# Patient Record
Sex: Female | Born: 1960 | Race: White | Hispanic: No | Marital: Married | Smoking: Never smoker
Health system: Southern US, Community
[De-identification: ages and names within clinical notes are randomized; demographics above are authoritative.]

## PROBLEM LIST (undated history)

## (undated) DIAGNOSIS — F419 Anxiety disorder, unspecified: Secondary | ICD-10-CM

## (undated) HISTORY — PX: CHOLECYSTECTOMY: SHX55

---

## 2006-05-06 ENCOUNTER — Ambulatory Visit: Payer: Self-pay | Admitting: Internal Medicine

## 2006-05-21 ENCOUNTER — Ambulatory Visit: Payer: Self-pay | Admitting: Family Medicine

## 2006-05-26 ENCOUNTER — Ambulatory Visit: Payer: Self-pay | Admitting: Family Medicine

## 2006-06-30 ENCOUNTER — Ambulatory Visit: Payer: Self-pay | Admitting: Gastroenterology

## 2006-07-08 ENCOUNTER — Ambulatory Visit: Payer: Self-pay | Admitting: Gastroenterology

## 2006-08-03 ENCOUNTER — Ambulatory Visit: Payer: Self-pay | Admitting: Surgery

## 2007-01-23 ENCOUNTER — Emergency Department: Payer: Self-pay | Admitting: Emergency Medicine

## 2007-01-23 ENCOUNTER — Other Ambulatory Visit: Payer: Self-pay

## 2007-05-24 ENCOUNTER — Ambulatory Visit: Payer: Self-pay | Admitting: Nurse Practitioner

## 2007-11-24 ENCOUNTER — Ambulatory Visit: Payer: Self-pay | Admitting: Gastroenterology

## 2008-10-02 ENCOUNTER — Ambulatory Visit: Payer: Self-pay | Admitting: Nurse Practitioner

## 2009-08-05 ENCOUNTER — Ambulatory Visit: Payer: Self-pay | Admitting: Family Medicine

## 2009-08-29 ENCOUNTER — Ambulatory Visit: Payer: Self-pay | Admitting: Family Medicine

## 2009-10-26 ENCOUNTER — Ambulatory Visit: Payer: Self-pay | Admitting: Nurse Practitioner

## 2009-11-12 ENCOUNTER — Ambulatory Visit: Payer: Self-pay | Admitting: Nurse Practitioner

## 2009-11-13 ENCOUNTER — Ambulatory Visit: Payer: Self-pay | Admitting: Nurse Practitioner

## 2011-10-07 ENCOUNTER — Ambulatory Visit: Payer: Self-pay | Admitting: Internal Medicine

## 2012-10-22 ENCOUNTER — Ambulatory Visit: Payer: Self-pay | Admitting: Emergency Medicine

## 2015-05-15 ENCOUNTER — Encounter: Payer: Self-pay | Admitting: *Deleted

## 2015-05-15 ENCOUNTER — Ambulatory Visit
Admission: EM | Admit: 2015-05-15 | Discharge: 2015-05-15 | Disposition: A | Discharge status: Home / Self Care | Payer: 59 | Attending: Family Medicine | Admitting: Family Medicine

## 2015-05-15 DIAGNOSIS — R300 Dysuria: Secondary | ICD-10-CM

## 2015-05-15 DIAGNOSIS — R350 Frequency of micturition: Secondary | ICD-10-CM

## 2015-05-15 LAB — URINALYSIS COMPLETE WITH MICROSCOPIC (ARMC ONLY)
BACTERIA UA: NONE SEEN
Bilirubin Urine: NEGATIVE
Glucose, UA: NEGATIVE mg/dL
HGB URINE DIPSTICK: NEGATIVE
Ketones, ur: NEGATIVE mg/dL
LEUKOCYTES UA: NEGATIVE
NITRITE: NEGATIVE
PH: 7 (ref 5.0–8.0)
PROTEIN: NEGATIVE mg/dL
RBC / HPF: NONE SEEN RBC/hpf (ref 0–5)
Specific Gravity, Urine: 1.01 (ref 1.005–1.030)
WBC UA: NONE SEEN WBC/hpf (ref 0–5)

## 2015-05-15 MED ORDER — CIPROFLOXACIN HCL 250 MG PO TABS: 250.0000 mg | ORAL_TABLET | Freq: Two times a day (BID) | ORAL | Status: AC

## 2015-05-15 NOTE — ED Notes (Signed)
Patient started having symptoms of lower pelvic pain, tiredness, and urinary urgency two days ago. Last reported UTI occurred 10 years ago.

## 2015-05-15 NOTE — ED Provider Notes (Signed)
CSN: [id number]     Arrival date & time 05/15/15  1225 History   First MD Initiated Contact with Patient 05/15/15 1510     Chief Complaint  Patient presents with  . Urinary Tract Infection   (Consider location/radiation/quality/duration/timing/severity/associated sxs/prior Treatment) Patient is a 54 y.o. female presenting with dysuria. The history is provided by the patient.  Dysuria Pain quality:  Burning and aching Pain severity:  Mild Onset quality:  Sudden Duration:  2 days Timing:  Constant Progression:  Worsening Chronicity:  New Recent urinary tract infections: no   Relieved by:  None tried Urinary symptoms: frequent urination   Urinary symptoms: no hematuria and no bladder incontinence   Associated symptoms: no abdominal pain, no fever, no flank pain, no nausea, no vaginal discharge and no vomiting     History reviewed. No pertinent past medical history. Past Surgical History  Procedure Laterality Date  . Cholecystectomy     History reviewed. No pertinent family history. Social History  Substance Use Topics  . Smoking status: Never Smoker   . Smokeless tobacco: Never Used  . Alcohol Use: Yes   OB History    No data available     Review of Systems  Constitutional: Negative for fever.  Gastrointestinal: Negative for nausea, vomiting and abdominal pain.  Genitourinary: Positive for dysuria. Negative for flank pain and vaginal discharge.    Allergies  Review of patient's allergies indicates no known allergies.  Home Medications   Prior to Admission medications   Medication Sig Start Date End Date Taking? Authorizing Provider  ciprofloxacin (CIPRO) 250 MG tablet Take 1 tablet (250 mg total) by mouth every 12 (twelve) hours. 05/15/15   Norval Gable, MD   Meds Ordered and Administered this Visit  Medications - No data to display  BP 138/83 mmHg  Pulse 86  Temp(Src) 98 F (36.7 C) (Oral)  Resp 18  Ht 5\' 3"  (1.6 m)  Wt 130 lb (58.968 kg)  BMI 23.03  kg/m2  SpO2 100% No data found.   Physical Exam  Constitutional: She appears well-developed and well-nourished. No distress.  Abdominal: Soft. Bowel sounds are normal. She exhibits no distension and no mass. There is tenderness (suprapubic tenderness to palpation; no rebound or guarding). There is no rebound and no guarding.  Skin: She is not diaphoretic.  Nursing note and vitals reviewed.   ED Course  Procedures (including critical care time)  Labs Review Labs Reviewed  URINALYSIS COMPLETEWITH MICROSCOPIC ([facility] ONLY) - Abnormal; Notable for the following:    Color, Urine STRAW (*)    Squamous Epithelial / LPF 0-5 (*)    All other components within normal limits  URINE CULTURE    Imaging Review No results found.   Visual Acuity Review  Right Eye Distance:   Left Eye Distance:   Bilateral Distance:    Right Eye Near:   Left Eye Near:    Bilateral Near:         MDM   1. Urinary frequency   2. Dysuria    Discharge Medication List as of 05/15/2015  3:26 PM    START taking these medications   Details  ciprofloxacin (CIPRO) 250 MG tablet Take 1 tablet (250 mg total) by mouth every 12 (twelve) hours., Starting 05/15/2015, Until Discontinued, Normal       1. Lab results and diagnosis reviewed with patient 2. rx as per orders above; reviewed possible side effects, interactions, risks and benefits 3. Recommend supportive treatment with increased fluids, otc analgesics  4. Follow-up prn if symptoms worsen or don't improve    Norval Gable, MD 05/15/15 2126

## 2015-05-17 LAB — URINE CULTURE: Culture: 4000

## 2015-05-17 NOTE — ED Notes (Signed)
Final report of urine C&S shows insignificant growth 

## 2015-10-20 ENCOUNTER — Ambulatory Visit (INDEPENDENT_AMBULATORY_CARE_PROVIDER_SITE_OTHER): Payer: 59

## 2015-10-20 ENCOUNTER — Encounter: Payer: Self-pay | Admitting: *Deleted

## 2015-10-20 ENCOUNTER — Ambulatory Visit
Admission: EM | Admit: 2015-10-20 | Discharge: 2015-10-20 | Disposition: A | Discharge status: Home / Self Care | Payer: 59 | Attending: Family Medicine | Admitting: Family Medicine

## 2015-10-20 DIAGNOSIS — R0781 Pleurodynia: Secondary | ICD-10-CM

## 2015-10-20 DIAGNOSIS — M940 Chondrocostal junction syndrome [Tietze]: Secondary | ICD-10-CM | POA: Insufficient documentation

## 2015-10-20 DIAGNOSIS — R079 Chest pain, unspecified: Secondary | ICD-10-CM | POA: Diagnosis present

## 2015-10-20 MED ORDER — TRAMADOL HCL 50 MG PO TABS: 50.0000 mg | ORAL_TABLET | Freq: Two times a day (BID) | ORAL | Status: AC | PRN

## 2015-10-20 MED ORDER — KETOROLAC TROMETHAMINE 60 MG/2ML IM SOLN
60.0000 mg | Freq: Once | INTRAMUSCULAR | Status: AC
  Administered 2015-10-20: 60 mg via INTRAMUSCULAR

## 2015-10-20 MED ORDER — MELOXICAM 15 MG PO TABS: 15.0000 mg | ORAL_TABLET | Freq: Every day | ORAL | Status: AC

## 2015-10-20 NOTE — ED Notes (Signed)
Right lateral and anterior right lower rib pain, sudden onset Tuesday. Denies injury. Pain worse with movement and breathing.

## 2015-10-20 NOTE — ED Provider Notes (Addendum)
CSN: [id number]     Arrival date & time 10/20/15  1138 History   First MD Initiated Contact with Patient 10/20/15 1230    Nurses notes were reviewed. Chief Complaint  Patient presents with  . Chest Pain  Patient reports since having URI a few weeks ago. She sneezed this past Tuesday and started developing right rib pain/chest pain progressively gotten worse since Tuesday. She reports if she lays left-sided night to sleep she is unable to rest because it wakes her up. She reports feeling miserable and the pain is continuing to get progressively worse. She denies any pain on the left side. She has pain when she moves she takes a deep breath involving her right side. She denies any trauma or injury to the right side right wrist.  She does not smoke she avoids who do smoke. She's had gallbladder surgery she's on no chronic medications and she doesn't have any drug allergies.. There is some family history of hypertension. (Consider location/radiation/quality/duration/timing/severity/associated sxs/prior Treatment) Patient is a 55 y.o. female presenting with chest pain. The history is provided by the patient. No language interpreter was used.  Chest Pain Pain location:  R chest and R lateral chest Pain quality: aching, pressure, sharp and stabbing   Pain radiates to:  Does not radiate Pain radiates to the back: no   Pain severity:  Severe Onset quality:  Sudden Duration:  5 days Progression:  Worsening Chronicity:  New Context: breathing and movement   Context: no stress and no trauma   Relieved by:  Nothing Ineffective treatments:  None tried Associated symptoms: no abdominal pain, no altered mental status, no cough, no dizziness, no numbness and no palpitations   Risk factors: no coronary artery disease, no diabetes mellitus, no high cholesterol, no hypertension, not female, not obese, not pregnant, no prior DVT/PE, no smoking and no surgery     History reviewed. No pertinent past medical  history. Past Surgical History  Procedure Laterality Date  . Cholecystectomy     History reviewed. No pertinent family history. Social History  Substance Use Topics  . Smoking status: Never Smoker   . Smokeless tobacco: Never Used  . Alcohol Use: Yes   OB History    No data available     Review of Systems  Respiratory: Negative for cough.   Cardiovascular: Positive for chest pain. Negative for palpitations.  Gastrointestinal: Negative for abdominal pain.  Neurological: Negative for dizziness and numbness.    Allergies  Review of patient's allergies indicates no known allergies.  Home Medications   Prior to Admission medications   Medication Sig Start Date End Date Taking? Authorizing Provider  ciprofloxacin (CIPRO) 250 MG tablet Take 1 tablet (250 mg total) by mouth every 12 (twelve) hours. 05/15/15   Norval Gable, MD  meloxicam (MOBIC) 15 MG tablet Take 1 tablet (15 mg total) by mouth daily. 10/20/15   Frederich Cha, MD  traMADol (ULTRAM) 50 MG tablet Take 1 tablet (50 mg total) by mouth every 12 (twelve) hours as needed for moderate pain or severe pain (Pain that keeps you from sleeping and resting may cause sedation). 10/20/15   Frederich Cha, MD   Meds Ordered and Administered this Visit   Medications  ketorolac (TORADOL) injection 60 mg (60 mg Intramuscular Given 10/20/15 1252)    BP 153/92 mmHg  Pulse 90  Temp(Src) 98 F (36.7 C) (Oral)  Resp 16  Ht 5\' 3"  (1.6 m)  Wt 128 lb (58.06 kg)  BMI 22.68 kg/m2  SpO2 99% No data found.   Physical Exam  Constitutional: She is oriented to person, place, and time. She appears well-developed and well-nourished.  HENT:  Head: Normocephalic and atraumatic.  Eyes: Pupils are equal, round, and reactive to light.  Neck: Normal range of motion. Neck supple. No thyromegaly present.  Cardiovascular: Normal rate, regular rhythm, S1 normal, S2 normal and normal heart sounds.   Pulmonary/Chest: Effort normal and breath sounds normal.  She has no decreased breath sounds. She has no wheezes. She exhibits tenderness and bony tenderness.    Abdominal: Soft. Normal appearance. She exhibits no distension. There is no tenderness. There is no CVA tenderness, no tenderness at McBurney's point and negative Murphy's sign. No hernia.  Neurological: She is alert and oriented to person, place, and time.  Skin: Skin is warm and dry. No rash noted.  Psychiatric: She has a normal mood and affect.  Vitals reviewed.   ED Course  Procedures (including critical care time)  Labs Review Labs Reviewed - No data to display  Imaging Review Dg Ribs Unilateral W/chest Right  10/20/2015  CLINICAL DATA:  Right-sided rib pain 4 days.  No injury. EXAM: RIGHT RIBS AND CHEST - 3+ VIEW COMPARISON:  None. FINDINGS: Lungs are clear. Cardiomediastinal silhouette is within normal. There is mild curvature of the thoracic spine convex right. Mild degenerative change of the spine. No evidence of right rib fracture. IMPRESSION: No acute findings. Electronically Signed   By: Marin Olp M.D.   On: 10/20/2015 13:21     Visual Acuity Review  Right Eye Distance:   Left Eye Distance:   Bilateral Distance:    Right Eye Near:   Left Eye Near:    Bilateral Near:         MDM   1. Rib pain on right side   2. Costochondritis, acute    Some improvement with the Toradol injection. Continue with anti-inflammatory such as Mobic 15 mg. Will also give her tramadol to use at night to help her restl and  sleep. Follow-up with PCP of choice if not better in 2 weeks and she doesn't feel like she is improving.  ED ECG REPORT I, Reyne Falconi H, the attending physician, personally viewed and interpreted this ECG.   Date: 10/20/2015  EKG Time:13:05:03  Rate: 70  Rhythm: normal sinus rhythm  Axis: 75  Intervals:none  ST&T Change: none borderline L Atrium enlargement  Note: This dictation was prepared with Dragon dictation along with smaller phrase technology.  Any transcriptional errors that result from this process are unintentional.  Frederich Cha, MD 10/20/15 1347  Frederich Cha, MD 10/20/15 1355

## 2015-10-20 NOTE — Discharge Instructions (Signed)
Costochondritis  Costochondritis is a condition in which the tissue (cartilage) that connects your ribs with your breastbone (sternum) becomes irritated. It causes pain in the chest and rib area. It usually goes away on its own over time.  HOME CARE  · Avoid activities that wear you out.  · Do not strain your ribs. Avoid activities that use your:    Chest.    Belly.    Side muscles.  · Put ice on the area for the first 2 days after the pain starts.    Put ice in a plastic bag.    Place a towel between your skin and the bag.    Leave the ice on for 20 minutes, 2-3 times a day.  · Only take medicine as told by your doctor.  GET HELP IF:  · You have redness or puffiness (swelling) in the rib area.  · Your pain does not go away with rest or medicine.  GET HELP RIGHT AWAY IF:   · Your pain gets worse.  · You are very uncomfortable.  · You have trouble breathing.  · You cough up blood.  · You start sweating or throwing up (vomiting).  · You have a fever or lasting symptoms for more than 2-3 days.  · You have a fever and your symptoms suddenly get worse.  MAKE SURE YOU:   · Understand these instructions.  · Will watch your condition.  · Will get help right away if you are not doing well or get worse.     This information is not intended to replace advice given to you by your health care provider. Make sure you discuss any questions you have with your health care provider.     Document Released: 11/12/2007 Document Revised: 01/26/2013 Document Reviewed: 12/28/2012  Elsevier Interactive Patient Education ©2016 [employer]

## 2016-07-31 ENCOUNTER — Encounter: Payer: Self-pay | Admitting: *Deleted

## 2016-07-31 ENCOUNTER — Ambulatory Visit
Admission: EM | Admit: 2016-07-31 | Discharge: 2016-07-31 | Disposition: A | Discharge status: Home / Self Care | Payer: 59 | Attending: Family Medicine | Admitting: Family Medicine

## 2016-07-31 DIAGNOSIS — R0789 Other chest pain: Secondary | ICD-10-CM | POA: Diagnosis not present

## 2016-07-31 DIAGNOSIS — R200 Anesthesia of skin: Secondary | ICD-10-CM | POA: Diagnosis present

## 2016-07-31 DIAGNOSIS — F419 Anxiety disorder, unspecified: Secondary | ICD-10-CM | POA: Insufficient documentation

## 2016-07-31 DIAGNOSIS — R079 Chest pain, unspecified: Secondary | ICD-10-CM | POA: Diagnosis not present

## 2016-07-31 HISTORY — DX: Anxiety disorder, unspecified: F41.9

## 2016-07-31 MED ORDER — ASPIRIN 81 MG PO CHEW
324.0000 mg | CHEWABLE_TABLET | Freq: Once | ORAL | Status: AC
  Administered 2016-07-31: 324 mg via ORAL

## 2016-07-31 NOTE — ED Provider Notes (Signed)
[facility]    CSN: [id number] Arrival date & time: 07/31/16  1855     History   Chief Complaint Chief Complaint  Patient presents with  . Chest Pain  . Numbness    HPI Margaret Mayo is a 56 y.o. female.   Patient is a 56 year old white female who since Monday has been having heart racing and palpitations off and on. That is a new finding for her and while that has occurred for the last previous 3 days today while she was driving home started having now in the palpitations chest tightness and sensation of the chest tightness going up her left neck and down the left arm. She also had some numbness of the left arm and left leg she's had numbness of the arm and leg before but never had chest pain that she had today. Is no history of hypertension but blood pressures elevated here no hyperlipidemia she does not smoke mother father in good health but her maternal grandfather died in 79s from sudden death no known drug allergies. His had her gallbladder out only surgery as she is on no chronic medications.   The history is provided by the patient. No language interpreter was used.  Chest Pain  Pain location:  Substernal area Pain quality: pressure   Pain radiates to:  Neck and L arm Pain severity:  Moderate Onset quality:  Sudden Timing:  Constant Progression:  Partially resolved Chronicity:  New Context: not breathing   Relieved by:  Nothing Exacerbated by: under stress. Associated symptoms: no shortness of breath     Past Medical History:  Diagnosis Date  . Anxiety     There are no active problems to display for this patient.   Past Surgical History:  Procedure Laterality Date  . CHOLECYSTECTOMY      OB History    No data available       Home Medications    Prior to Admission medications   Medication Sig Start Date End Date Taking? Authorizing Provider  ciprofloxacin (CIPRO) 250 MG tablet Take 1 tablet (250 mg total) by mouth every 12 (twelve)  hours. 05/15/15   Norval Gable, MD  meloxicam (MOBIC) 15 MG tablet Take 1 tablet (15 mg total) by mouth daily. 10/20/15   Frederich Cha, MD  traMADol (ULTRAM) 50 MG tablet Take 1 tablet (50 mg total) by mouth every 12 (twelve) hours as needed for moderate pain or severe pain (Pain that keeps you from sleeping and resting may cause sedation). 10/20/15   Frederich Cha, MD    Family History History reviewed. No pertinent family history.  Social History Social History  Substance Use Topics  . Smoking status: Never Smoker  . Smokeless tobacco: Never Used  . Alcohol use Yes     Allergies   Patient has no known allergies.   Review of Systems Review of Systems  Constitutional: Negative.   Respiratory: Positive for chest tightness. Negative for shortness of breath.   Cardiovascular: Positive for chest pain.  All other systems reviewed and are negative.    Physical Exam Triage Vital Signs ED Triage Vitals  Enc Vitals Group     BP 07/31/16 1909 (!) 169/87     Pulse Rate 07/31/16 1909 84     Resp 07/31/16 1909 16     Temp 07/31/16 1909 98.1 F (36.7 C)     Temp Source 07/31/16 1909 Oral     SpO2 07/31/16 1909 100 %     Weight 07/31/16  1910 130 lb (59 kg)     Height 07/31/16 1910 5\' 3"  (1.6 m)     Head Circumference --      Peak Flow --      Pain Score 07/31/16 1912 7     Pain Loc --      Pain Edu? --      Excl. in [place]? --    No data found.   Updated Vital Signs BP (!) 169/87 (BP Location: Right Arm)   Pulse 84   Temp 98.1 F (36.7 C) (Oral)   Resp 16   Ht 5\' 3"  (1.6 m)   Wt 130 lb (59 kg)   SpO2 100%   BMI 23.03 kg/m   Visual Acuity Right Eye Distance:   Left Eye Distance:   Bilateral Distance:    Right Eye Near:   Left Eye Near:    Bilateral Near:     Physical Exam  Constitutional: She is oriented to person, place, and time. She appears well-developed and well-nourished.  HENT:  Head: Normocephalic and atraumatic.  Eyes: Conjunctivae are normal. Pupils are  equal, round, and reactive to light.  Neck: Trachea normal, normal range of motion and full passive range of motion without pain. Neck supple. No thyroid mass and no thyromegaly present.  Cardiovascular: Normal rate, regular rhythm, S1 normal and normal heart sounds.   Pulmonary/Chest: Effort normal and breath sounds normal. She exhibits no mass and no tenderness.  Abdominal: Soft.  Musculoskeletal: Normal range of motion.  Lymphadenopathy:    She has no cervical adenopathy.  Neurological: She is alert and oriented to person, place, and time.  Skin: Skin is warm.  Psychiatric: She has a normal mood and affect.  Vitals reviewed.    UC Treatments / Results  Labs (all labs ordered are listed, but only abnormal results are displayed) Labs Reviewed - No data to display  EKG  EKG Interpretation None      ED ECG REPORT I, Mirko Tailor H, the attending physician, personally viewed and interpreted this ECG.   Date: 07/31/2016  EKG Time: 19:18:43  Rate: 78  Rhythm: normal EKG, normal sinus rhythm, there are no previous tracings available for comparison  Axis: 71  Intervals:none  ST&T Change: none  Radiology No results found.  Procedures Procedures (including critical care time)  Medications Ordered in UC Medications  aspirin chewable tablet 324 mg (324 mg Oral Given 07/31/16 1944)     Initial Impression / Assessment and Plan / UC Course  I have reviewed the triage vital signs and the nursing notes.  Pertinent labs & imaging results that were available during my care of the patient were reviewed by me and considered in my medical decision making (see chart for details).   patient because of her age and inability to do any reproduction of the chest pain with palpation of her chest wall will need to go to the emergency room of her choice. Blood pressures also was elevated so concerned that she does need to be evaluated with probable serial cardiac enzymes. She and her husband  have decided to go to [facility] ED. He is also going to drive her the choice. 4 baby aspirin as we given here and they'll be notified of their pending arrival  Final Clinical Impressions(s) / UC Diagnoses   Final diagnoses:  Chest pain, unspecified type  Chest tightness or pressure    New Prescriptions New Prescriptions   No medications on file    Note: This  dictation was prepared with Dragon dictation along with smaller phrase technology. Any transcriptional errors that result from this process are unintentional.   Frederich Cha, MD 07/31/16 2000

## 2016-07-31 NOTE — ED Notes (Signed)
Smile is symmetric, negative arm drift, speech is clear, and no facial ptosis.

## 2016-07-31 NOTE — ED Triage Notes (Signed)
Chest "pressure" 7/10 onset today approx 1700. Pt has had palpitations since Monday. Pressure does not radiate. She has had diaphoresis with pressure at times. Also states her left arm, left face and thigh are numb.

## 2016-07-31 NOTE — ED Notes (Signed)
Report called to Jesus, Warehouse manager at [facility].

## 2016-09-03 ENCOUNTER — Other Ambulatory Visit: Payer: Self-pay | Admitting: Family Medicine

## 2016-09-03 DIAGNOSIS — Z1231 Encounter for screening mammogram for malignant neoplasm of breast: Secondary | ICD-10-CM

## 2016-09-04 ENCOUNTER — Ambulatory Visit
Admission: RE | Admit: 2016-09-04 | Discharge: 2016-09-04 | Disposition: A | Discharge status: Home / Self Care | Payer: 59 | Source: Ambulatory Visit | Attending: Family Medicine | Admitting: Family Medicine

## 2016-09-04 DIAGNOSIS — Z1231 Encounter for screening mammogram for malignant neoplasm of breast: Secondary | ICD-10-CM | POA: Diagnosis not present

## 2016-09-09 ENCOUNTER — Inpatient Hospital Stay
Admission: RE | Admit: 2016-09-09 | Discharge: 2016-09-09 | Disposition: A | Discharge status: Home / Self Care | Payer: Self-pay | Source: Ambulatory Visit | Attending: *Deleted | Admitting: *Deleted

## 2016-09-09 ENCOUNTER — Other Ambulatory Visit: Payer: Self-pay | Admitting: *Deleted

## 2016-09-09 DIAGNOSIS — Z9289 Personal history of other medical treatment: Secondary | ICD-10-CM

## 2017-03-25 ENCOUNTER — Other Ambulatory Visit: Payer: Self-pay | Admitting: Family Medicine

## 2017-03-25 DIAGNOSIS — R102 Pelvic and perineal pain: Secondary | ICD-10-CM

## 2017-03-26 ENCOUNTER — Ambulatory Visit
Admission: RE | Admit: 2017-03-26 | Discharge: 2017-03-26 | Disposition: A | Discharge status: Home / Self Care | Payer: 59 | Source: Ambulatory Visit | Attending: Family Medicine | Admitting: Family Medicine

## 2017-03-26 DIAGNOSIS — R102 Pelvic and perineal pain: Secondary | ICD-10-CM | POA: Insufficient documentation

## 2017-03-26 DIAGNOSIS — D259 Leiomyoma of uterus, unspecified: Secondary | ICD-10-CM | POA: Insufficient documentation

## 2017-09-14 ENCOUNTER — Other Ambulatory Visit: Payer: Self-pay | Admitting: Family Medicine

## 2017-09-14 DIAGNOSIS — Z1231 Encounter for screening mammogram for malignant neoplasm of breast: Secondary | ICD-10-CM

## 2017-09-16 ENCOUNTER — Ambulatory Visit
Admission: RE | Admit: 2017-09-16 | Discharge: 2017-09-16 | Disposition: A | Discharge status: Home / Self Care | Payer: 59 | Source: Ambulatory Visit | Attending: Family Medicine | Admitting: Family Medicine

## 2017-09-16 DIAGNOSIS — Z1231 Encounter for screening mammogram for malignant neoplasm of breast: Secondary | ICD-10-CM | POA: Diagnosis present

## 2019-02-09 ENCOUNTER — Other Ambulatory Visit: Payer: Self-pay | Admitting: Family Medicine

## 2019-02-09 DIAGNOSIS — Z1231 Encounter for screening mammogram for malignant neoplasm of breast: Secondary | ICD-10-CM

## 2019-02-23 ENCOUNTER — Other Ambulatory Visit: Payer: Self-pay

## 2019-02-23 ENCOUNTER — Ambulatory Visit
Admission: RE | Admit: 2019-02-23 | Discharge: 2019-02-23 | Disposition: A | Discharge status: Home / Self Care | Payer: BC Managed Care – PPO | Source: Ambulatory Visit | Attending: Family Medicine | Admitting: Family Medicine

## 2019-02-23 DIAGNOSIS — Z1231 Encounter for screening mammogram for malignant neoplasm of breast: Secondary | ICD-10-CM | POA: Insufficient documentation

## 2019-04-25 ENCOUNTER — Ambulatory Visit
Admission: EM | Admit: 2019-04-25 | Discharge: 2019-04-25 | Disposition: A | Discharge status: Home / Self Care | Payer: BC Managed Care – PPO | Attending: Family Medicine | Admitting: Family Medicine

## 2019-04-25 ENCOUNTER — Ambulatory Visit (INDEPENDENT_AMBULATORY_CARE_PROVIDER_SITE_OTHER): Payer: BC Managed Care – PPO

## 2019-04-25 ENCOUNTER — Other Ambulatory Visit: Payer: Self-pay

## 2019-04-25 DIAGNOSIS — W228XXA Striking against or struck by other objects, initial encounter: Secondary | ICD-10-CM | POA: Diagnosis not present

## 2019-04-25 DIAGNOSIS — S2232XA Fracture of one rib, left side, initial encounter for closed fracture: Secondary | ICD-10-CM | POA: Diagnosis not present

## 2019-04-25 DIAGNOSIS — R0789 Other chest pain: Secondary | ICD-10-CM

## 2019-04-25 MED ORDER — HYDROCODONE-ACETAMINOPHEN 5-325 MG PO TABS: ORAL_TABLET | ORAL | 0 refills | Status: AC

## 2019-04-25 NOTE — ED Provider Notes (Signed)
[facility]    CSN: [id number] Arrival date & time: 04/25/19  0809      History   Chief Complaint Chief Complaint  Patient presents with  . Flank Pain    LEFT SIDE    HPI Margaret Mayo is a 58 y.o. female.   58 yo female with a c/o left lower rib pain after injuring while leaning on a hard edge while reaching for something yesterday. States she felt and heard a pop.    Flank Pain    Past Medical History:  Diagnosis Date  . Anxiety     There are no active problems to display for this patient.   Past Surgical History:  Procedure Laterality Date  . CHOLECYSTECTOMY      OB History   No obstetric history on file.      Home Medications    Prior to Admission medications   Medication Sig Start Date End Date Taking? Authorizing Provider  ciprofloxacin (CIPRO) 250 MG tablet Take 1 tablet (250 mg total) by mouth every 12 (twelve) hours. 05/15/15   Norval Gable, MD  HYDROcodone-acetaminophen (NORCO/VICODIN) 5-325 MG tablet 1-2 tabs po bid prn 04/25/19   Norval Gable, MD  meloxicam (MOBIC) 15 MG tablet Take 1 tablet (15 mg total) by mouth daily. 10/20/15   Frederich Cha, MD  traMADol (ULTRAM) 50 MG tablet Take 1 tablet (50 mg total) by mouth every 12 (twelve) hours as needed for moderate pain or severe pain (Pain that keeps you from sleeping and resting may cause sedation). 10/20/15   Frederich Cha, MD    Family History Family History  Problem Relation Age of Onset  . Kidney failure Mother   . Healthy Father   . Breast cancer Neg Hx     Social History Social History   Tobacco Use  . Smoking status: Never Smoker  . Smokeless tobacco: Never Used  Substance Use Topics  . Alcohol use: Yes  . Drug use: No     Allergies   Patient has no known allergies.   Review of Systems Review of Systems  Genitourinary: Positive for flank pain.     Physical Exam Triage Vital Signs ED Triage Vitals  Enc Vitals Group     BP 04/25/19 0821 (!) 158/91      Pulse Rate 04/25/19 0821 79     Resp 04/25/19 0821 18     Temp 04/25/19 0821 98.3 F (36.8 C)     Temp Source 04/25/19 0821 Oral     SpO2 04/25/19 0821 100 %     Weight 04/25/19 0822 135 lb (61.2 kg)     Height --      Head Circumference --      Peak Flow --      Pain Score 04/25/19 0820 5     Pain Loc --      Pain Edu? --      Excl. in [place]? --    No data found.  Updated Vital Signs BP (!) 158/91 (BP Location: Left Arm)   Pulse 79   Temp 98.3 F (36.8 C) (Oral)   Resp 18   Wt 61.2 kg   SpO2 100%   BMI 23.91 kg/m   Visual Acuity Right Eye Distance:   Left Eye Distance:   Bilateral Distance:    Right Eye Near:   Left Eye Near:    Bilateral Near:     Physical Exam Vitals signs and nursing note reviewed.  Constitutional:  General: She is not in acute distress.    Appearance: She is not toxic-appearing.  Cardiovascular:     Rate and Rhythm: Normal rate.  Pulmonary:     Effort: Pulmonary effort is normal. No respiratory distress.     Breath sounds: Normal breath sounds.  Chest:     Chest wall: Tenderness (left lower chest wall ribs) present.  Neurological:     Mental Status: She is alert.      UC Treatments / Results  Labs (all labs ordered are listed, but only abnormal results are displayed) Labs Reviewed - No data to display  EKG   Radiology Dg Ribs Unilateral W/chest Left  Result Date: 04/25/2019 CLINICAL DATA:  Pain after sudden change in position EXAM: LEFT RIBS AND CHEST - 3+ VIEW COMPARISON:  Oct 20, 2015 FINDINGS: Frontal chest as well as oblique and cone-down rib images were obtained. Lungs are clear. Heart size and pulmonary vascularity are normal. No adenopathy. There is stable thoracolumbar levoscoliosis. There is a nondisplaced fracture of the anterior left eighth rib. No other fracture evident. No pneumothorax or pleural effusion. IMPRESSION: Nondisplaced fracture anterior left eighth rib. No other fracture evident. No pneumothorax.  Lungs clear. These results will be called to the ordering clinician or representative by the Radiologist Assistant, and communication documented in the PACS or zVision Dashboard. Electronically Signed   By: Lowella Grip III M.D.   On: 04/25/2019 09:27    Procedures Procedures (including critical care time)  Medications Ordered in UC Medications - No data to display  Initial Impression / Assessment and Plan / UC Course  I have reviewed the triage vital signs and the nursing notes.  Pertinent labs & imaging results that were available during my care of the patient were reviewed by me and considered in my medical decision making (see chart for details).      Final Clinical Impressions(s) / UC Diagnoses   Final diagnoses:  Closed fracture of one rib of left side, initial encounter     Discharge Instructions     Rest, ice, over the counter advil/tylenol    ED Prescriptions    Medication Sig Dispense Auth. Provider   HYDROcodone-acetaminophen (NORCO/VICODIN) 5-325 MG tablet 1-2 tabs po bid prn 15 tablet Shaylah Mcghie, MD      1. x-ray results and diagnosis reviewed with patient 2. rx as per orders above; reviewed possible side effects, interactions, risks and benefits  3. Recommend supportive treatment as above 4. Follow-up prn if symptoms worsen or don't improve  I have reviewed the PDMP during this encounter.   Norval Gable, MD 04/25/19 2059

## 2019-04-25 NOTE — Discharge Instructions (Addendum)
Rest, ice, over the counter advil/tylenol

## 2019-04-25 NOTE — ED Triage Notes (Signed)
Pt. States she went to reach for something with her left arm, she heard a pop & has been in pain ever since yesterday.

## 2019-07-07 IMAGING — MG MM DIGITAL SCREENING BILAT W/ TOMO W/ CAD
9 of 12 series · 9 of 28 positions shown · non-contrast
Comparison: Previous exam(s).

CLINICAL DATA: Screening.

EXAM:
DIGITAL SCREENING BILATERAL MAMMOGRAM WITH TOMO AND CAD

[R CC]
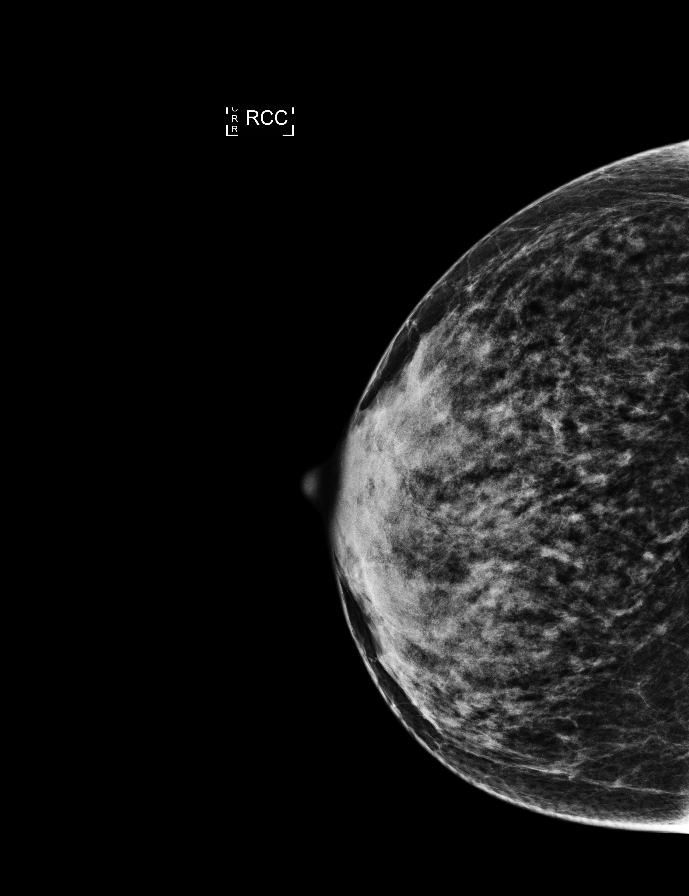

[L CC]
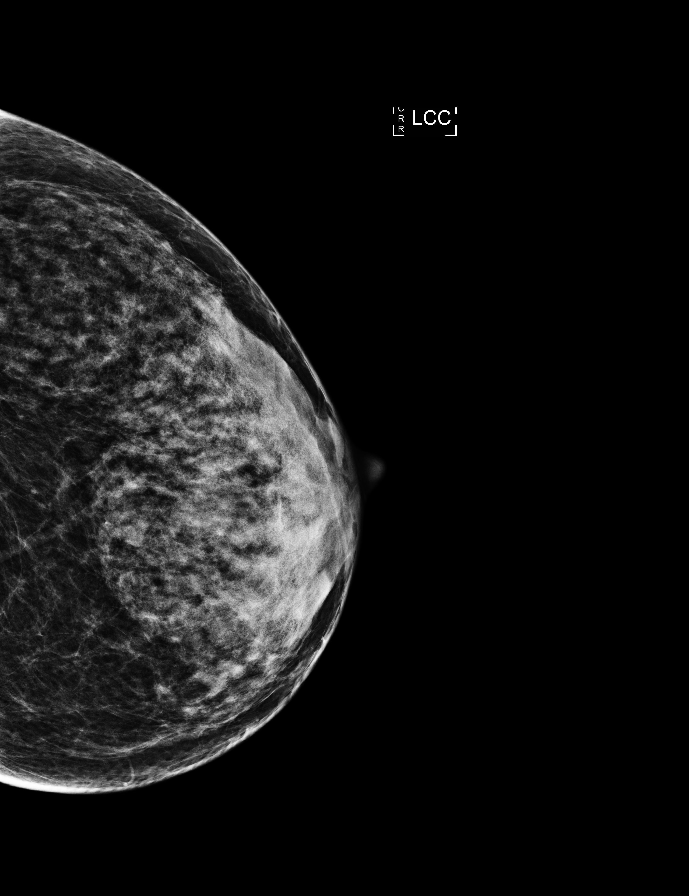

[R MLO synth-2D]
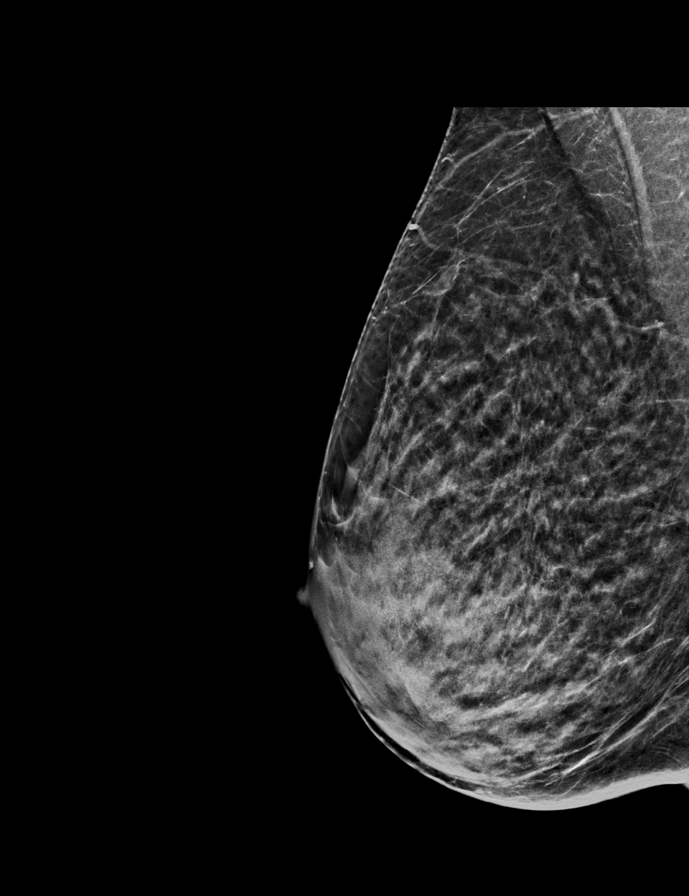

[L CC synth-2D]
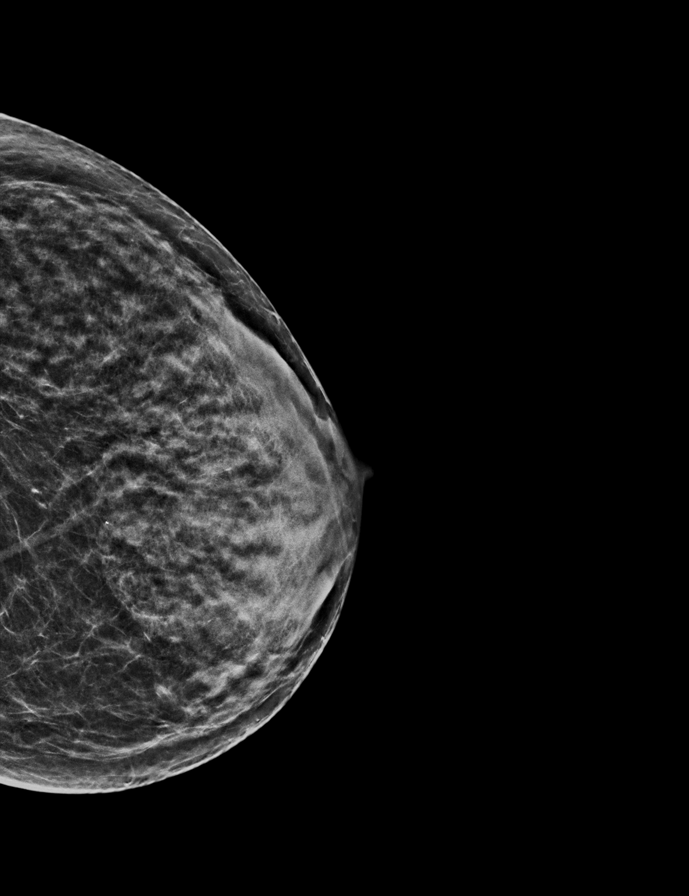

[R CC synth-2D]
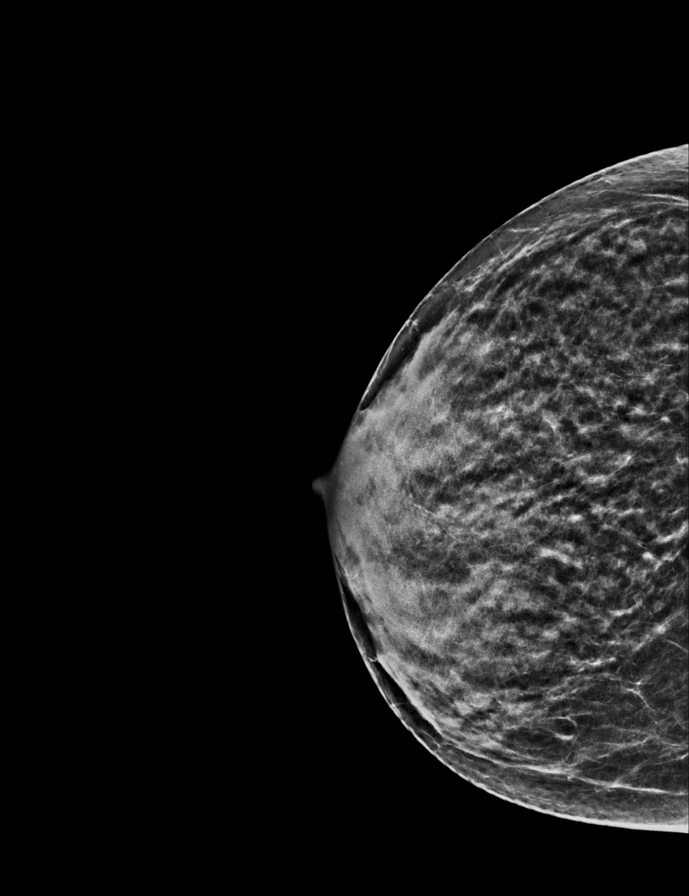

[R MLO]
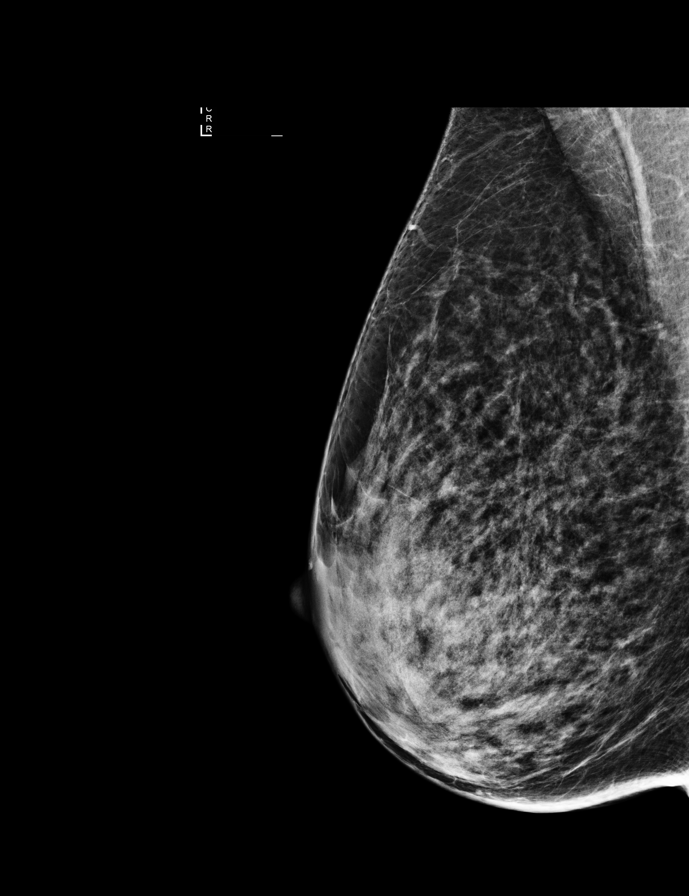

[L MLO synth-2D]
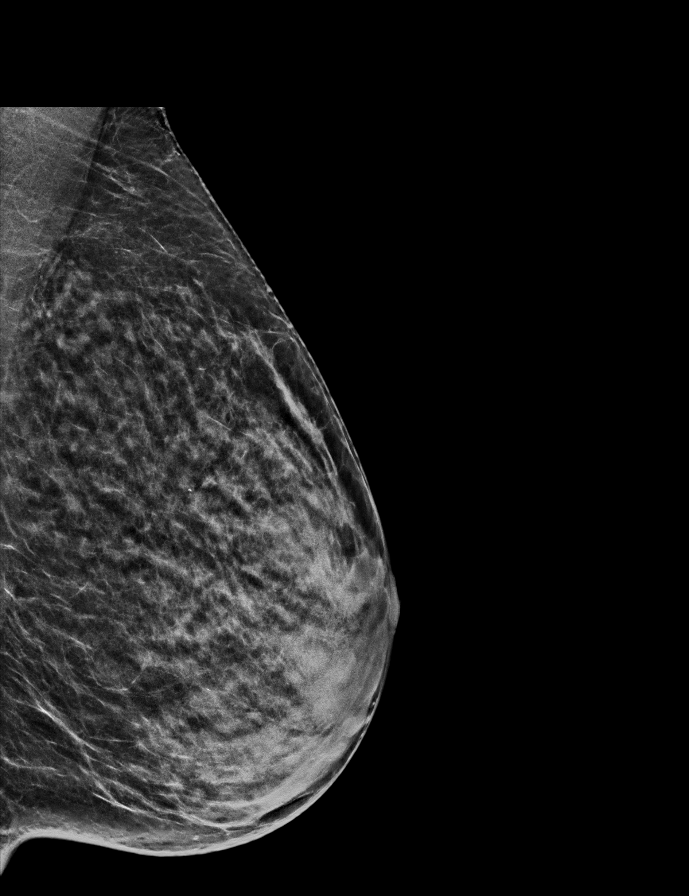

[L MLO]
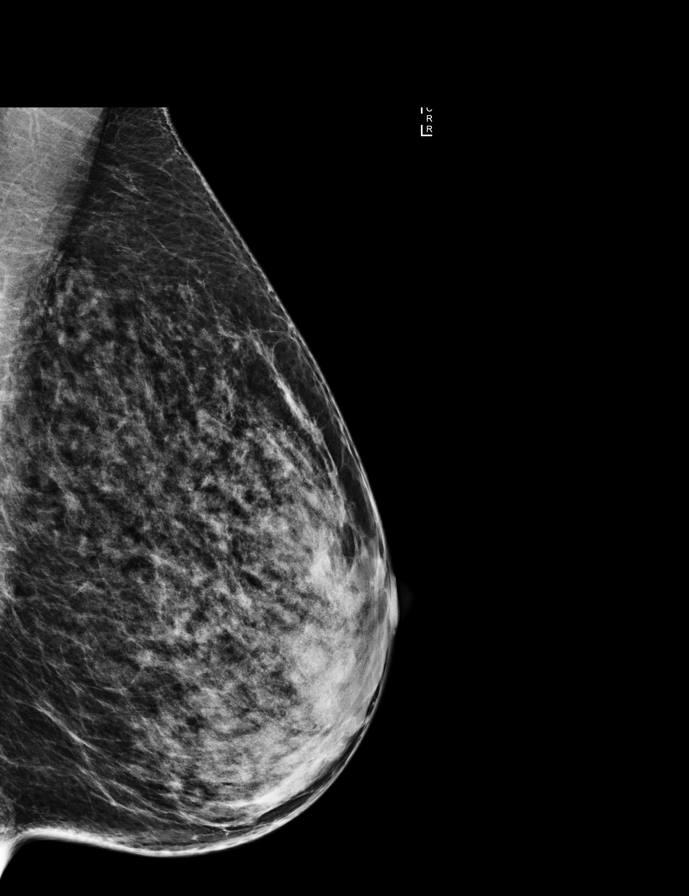

[L CC tomo · tomo slice 27/53.0]
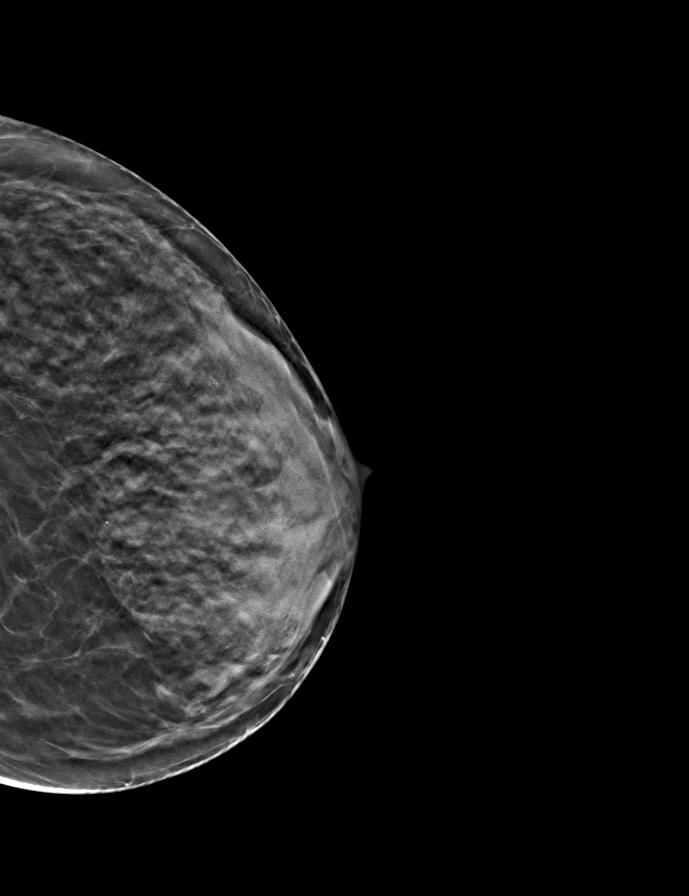

[9 of 28 positions shown; findings below may reference images not displayed]

ACR Breast Density Category d: The breast tissue is extremely dense,
which lowers the sensitivity of mammography.
FINDINGS: There are no findings suspicious for malignancy. Images were
processed with CAD.
IMPRESSION: No mammographic evidence of malignancy. A result letter of this
screening mammogram will be mailed directly to the patient.

RECOMMENDATION:
Screening mammogram in one year. (Code:RA-[id number])

BI-RADS CATEGORY  1: Negative.

## 2020-03-28 ENCOUNTER — Other Ambulatory Visit: Payer: Self-pay | Admitting: Family Medicine

## 2020-03-28 DIAGNOSIS — Z1231 Encounter for screening mammogram for malignant neoplasm of breast: Secondary | ICD-10-CM

## 2020-04-12 ENCOUNTER — Ambulatory Visit
Admission: RE | Admit: 2020-04-12 | Discharge: 2020-04-12 | Disposition: A | Discharge status: Home / Self Care | Payer: BC Managed Care – PPO | Source: Ambulatory Visit | Attending: Family Medicine | Admitting: Family Medicine

## 2020-04-12 ENCOUNTER — Other Ambulatory Visit: Payer: Self-pay

## 2020-04-12 DIAGNOSIS — Z1231 Encounter for screening mammogram for malignant neoplasm of breast: Secondary | ICD-10-CM | POA: Diagnosis present

## 2022-03-24 ENCOUNTER — Other Ambulatory Visit: Payer: Self-pay | Admitting: Family Medicine

## 2022-03-24 DIAGNOSIS — Z1231 Encounter for screening mammogram for malignant neoplasm of breast: Secondary | ICD-10-CM

## 2022-04-08 ENCOUNTER — Ambulatory Visit
Admission: RE | Admit: 2022-04-08 | Discharge: 2022-04-08 | Disposition: A | Discharge status: Home / Self Care | Payer: Managed Care, Other (non HMO) | Source: Ambulatory Visit | Attending: Family Medicine | Admitting: Family Medicine

## 2022-04-08 DIAGNOSIS — Z1231 Encounter for screening mammogram for malignant neoplasm of breast: Secondary | ICD-10-CM | POA: Diagnosis not present

## 2023-03-10 ENCOUNTER — Other Ambulatory Visit: Payer: Self-pay | Admitting: Family Medicine

## 2023-03-10 DIAGNOSIS — Z1231 Encounter for screening mammogram for malignant neoplasm of breast: Secondary | ICD-10-CM

## 2023-04-13 ENCOUNTER — Ambulatory Visit
Admission: RE | Admit: 2023-04-13 | Discharge: 2023-04-13 | Disposition: A | Discharge status: Home / Self Care | Payer: Managed Care, Other (non HMO) | Source: Ambulatory Visit | Attending: Family Medicine | Admitting: Family Medicine

## 2023-04-13 DIAGNOSIS — Z1231 Encounter for screening mammogram for malignant neoplasm of breast: Secondary | ICD-10-CM | POA: Insufficient documentation
